# Patient Record
Sex: Male | Born: 1970 | Race: White | Hispanic: No | Marital: Married | Smoking: Never smoker
Health system: Southern US, Community
[De-identification: ages and names within clinical notes are randomized; demographics above are authoritative.]

## PROBLEM LIST (undated history)

## (undated) DIAGNOSIS — F419 Anxiety disorder, unspecified: Secondary | ICD-10-CM

## (undated) DIAGNOSIS — I10 Essential (primary) hypertension: Secondary | ICD-10-CM

## (undated) DIAGNOSIS — K529 Noninfective gastroenteritis and colitis, unspecified: Secondary | ICD-10-CM

## (undated) DIAGNOSIS — G43909 Migraine, unspecified, not intractable, without status migrainosus: Secondary | ICD-10-CM

## (undated) HISTORY — PX: FOOT SURGERY: SHX648

---

## 2015-11-13 ENCOUNTER — Encounter (HOSPITAL_BASED_OUTPATIENT_CLINIC_OR_DEPARTMENT_OTHER): Payer: Self-pay

## 2015-11-13 ENCOUNTER — Emergency Department (HOSPITAL_BASED_OUTPATIENT_CLINIC_OR_DEPARTMENT_OTHER)
Admission: EM | Admit: 2015-11-13 | Discharge: 2015-11-13 | Disposition: A | Discharge status: Home / Self Care | Payer: BLUE CROSS/BLUE SHIELD | Attending: Emergency Medicine | Admitting: Emergency Medicine

## 2015-11-13 DIAGNOSIS — I10 Essential (primary) hypertension: Secondary | ICD-10-CM | POA: Diagnosis not present

## 2015-11-13 DIAGNOSIS — R1084 Generalized abdominal pain: Secondary | ICD-10-CM | POA: Insufficient documentation

## 2015-11-13 DIAGNOSIS — Z79899 Other long term (current) drug therapy: Secondary | ICD-10-CM | POA: Diagnosis not present

## 2015-11-13 DIAGNOSIS — R1013 Epigastric pain: Secondary | ICD-10-CM | POA: Diagnosis present

## 2015-11-13 HISTORY — DX: Noninfective gastroenteritis and colitis, unspecified: K52.9

## 2015-11-13 HISTORY — DX: Essential (primary) hypertension: I10

## 2015-11-13 HISTORY — DX: Anxiety disorder, unspecified: F41.9

## 2015-11-13 HISTORY — DX: Migraine, unspecified, not intractable, without status migrainosus: G43.909

## 2015-11-13 LAB — URINALYSIS, ROUTINE W REFLEX MICROSCOPIC
Bilirubin Urine: NEGATIVE
Glucose, UA: NEGATIVE mg/dL
HGB URINE DIPSTICK: NEGATIVE
Ketones, ur: NEGATIVE mg/dL
LEUKOCYTES UA: NEGATIVE
NITRITE: NEGATIVE
Protein, ur: NEGATIVE mg/dL
SPECIFIC GRAVITY, URINE: 1.02 (ref 1.005–1.030)
pH: 5.5 (ref 5.0–8.0)

## 2015-11-13 LAB — COMPREHENSIVE METABOLIC PANEL
ALK PHOS: 70 U/L (ref 38–126)
ALT: 31 U/L (ref 17–63)
AST: 23 U/L (ref 15–41)
Albumin: 4 g/dL (ref 3.5–5.0)
Anion gap: 5 (ref 5–15)
BUN: 15 mg/dL (ref 6–20)
CALCIUM: 8.6 mg/dL — AB (ref 8.9–10.3)
CHLORIDE: 108 mmol/L (ref 101–111)
CO2: 24 mmol/L (ref 22–32)
Creatinine, Ser: 0.93 mg/dL (ref 0.61–1.24)
GLUCOSE: 101 mg/dL — AB (ref 65–99)
Potassium: 3.7 mmol/L (ref 3.5–5.1)
Sodium: 137 mmol/L (ref 135–145)
Total Bilirubin: 0.5 mg/dL (ref 0.3–1.2)
Total Protein: 7 g/dL (ref 6.5–8.1)

## 2015-11-13 LAB — CBC WITH DIFFERENTIAL/PLATELET
Basophils Absolute: 0 10*3/uL (ref 0.0–0.1)
Basophils Relative: 0 %
Eosinophils Absolute: 0.1 10*3/uL (ref 0.0–0.7)
Eosinophils Relative: 1 %
HEMATOCRIT: 42.4 % (ref 39.0–52.0)
HEMOGLOBIN: 14.8 g/dL (ref 13.0–17.0)
LYMPHS ABS: 2.4 10*3/uL (ref 0.7–4.0)
LYMPHS PCT: 40 %
MCH: 30.9 pg (ref 26.0–34.0)
MCHC: 34.9 g/dL (ref 30.0–36.0)
MCV: 88.5 fL (ref 78.0–100.0)
Monocytes Absolute: 0.4 10*3/uL (ref 0.1–1.0)
Monocytes Relative: 7 %
NEUTROS ABS: 3.1 10*3/uL (ref 1.7–7.7)
NEUTROS PCT: 52 %
Platelets: 190 10*3/uL (ref 150–400)
RBC: 4.79 MIL/uL (ref 4.22–5.81)
RDW: 13.3 % (ref 11.5–15.5)
WBC: 5.9 10*3/uL (ref 4.0–10.5)

## 2015-11-13 MED ORDER — NAPROXEN 250 MG PO TABS
500.0000 mg | ORAL_TABLET | Freq: Once | ORAL | Status: AC
  Administered 2015-11-13: 500 mg via ORAL
  Filled 2015-11-13: qty 2

## 2015-11-13 NOTE — ED Triage Notes (Signed)
C/o abd pain "for months"-worse x 1 denies n/v/d-NAD-steady gait

## 2015-11-13 NOTE — Discharge Instructions (Signed)
We saw you in the ER for the abdominal pain. All of our results are normal. Kidney function is fine as well. We are not sure what is causing your abdominal pain, and recommend that you see your primary care doctor within 2-3 days for further evaluation. If your symptoms get worse, return to the ER. Take the pain meds and nausea meds as prescribed.

## 2015-11-13 NOTE — ED Provider Notes (Signed)
[facility]-EMERGENCY DEPT Provider Note   CSN: [id number] Arrival date & time: 11/13/15  1455     History   Chief Complaint Chief Complaint  Patient presents with  . Abdominal Pain    HPI Cristian Morales is a 45 y.o. male.  HPI Pt comes in with cc of abd pain. Pt reports that in May he did a mission trip to [place], and then developed infectious diarrhea. Pt took the antibiotics, and some of the symptoms persisted, so he saw GI doctor end of aug. PT had a colonoscopy and he was told he had colitis and started on different meds. This morning when he woke up, he had pain in the epigastrium and in the lower quadrants. Pain has been constant. He had a normal BM and denies nausea, emesis. Pain is 4/10 at this time. No dysuria.   Past Medical History:  Diagnosis Date  . Anxiety   . Colitis   . Hypertension   . Migraine     There are no active problems to display for this patient.   Past Surgical History:  Procedure Laterality Date  . FOOT SURGERY         Home Medications    Prior to Admission medications   Medication Sig Start Date End Date Taking? Authorizing Provider  buPROPion (WELLBUTRIN) 75 MG tablet Take 75 mg by mouth 2 (two) times daily.   Yes Historical Provider, MD  valsartan (DIOVAN) 160 MG tablet Take 160 mg by mouth daily.   Yes Historical Provider, MD  zonisamide (ZONEGRAN) 100 MG capsule Take 100 mg by mouth daily.   Yes Historical Provider, MD    Family History No family history on file.  Social History Social History  Substance Use Topics  . Smoking status: Never Smoker  . Smokeless tobacco: Never Used  . Alcohol use No     Allergies   Imitrex [sumatriptan]   Review of Systems Review of Systems   ROS 10 Systems reviewed and are negative for acute change except as noted in the HPI.     Physical Exam Updated Vital Signs BP 128/83   Pulse 61   Temp 98.7 F (37.1 C) (Oral)   Resp 16   Ht 6\' 2"  (1.88 m)   Wt (!) 320 lb (145.2 kg)   SpO2  99%   BMI 41.09 kg/m   Physical Exam  Constitutional: He is oriented to person, place, and time. He appears well-developed.  HENT:  Head: Normocephalic and atraumatic.  Eyes: Conjunctivae and EOM are normal. Pupils are equal, round, and reactive to light.  Neck: Normal range of motion. Neck supple.  Cardiovascular: Normal rate and regular rhythm.   Pulmonary/Chest: Effort normal and breath sounds normal.  Abdominal: Soft. Bowel sounds are normal. He exhibits no distension. There is tenderness. There is no rebound and no guarding.  Generalized tenderness without any rebound or guarding.  Neurological: He is alert and oriented to person, place, and time.  Skin: Skin is warm.     ED Treatments / Results  Labs (all labs ordered are listed, but only abnormal results are displayed) Labs Reviewed  COMPREHENSIVE METABOLIC PANEL - Abnormal; Notable for the following:       Result Value   Glucose, Bld 101 (*)    Calcium 8.6 (*)    All other components within normal limits  URINALYSIS, ROUTINE W REFLEX MICROSCOPIC (NOT AT [facility])  CBC WITH DIFFERENTIAL/PLATELET    EKG  EKG Interpretation None  Radiology No results found.  Procedures Procedures (including critical care time)  Medications Ordered in ED Medications  naproxen (NAPROSYN) tablet 500 mg (500 mg Oral Given 11/13/15 1716)     Initial Impression / Assessment and Plan / ED Course  I have reviewed the triage vital signs and the nursing notes.  Pertinent labs & imaging results that were available during my care of the patient were reviewed by me and considered in my medical decision making (see chart for details).  Clinical Course    Pt comes in with cc of abd pain. His exam and vital signs are reassuring. Lab results pending. Pt has had recent colitis discovered post colonoscopy. His pain is not significant enough where we suspect perforation due to colonoscopy, and he is post procedure by > 1 week. If labs  neg, we will advise close f/u with pcp.  Final Clinical Impressions(s) / ED Diagnoses   Final diagnoses:  Generalized abdominal pain    New Prescriptions Discharge Medication List as of 11/13/2015  5:51 PM       Derwood KaplanAnkit Mena Lienau, MD 11/19/15 0200

## 2017-06-29 ENCOUNTER — Emergency Department (HOSPITAL_BASED_OUTPATIENT_CLINIC_OR_DEPARTMENT_OTHER)
Admission: EM | Admit: 2017-06-29 | Discharge: 2017-06-29 | Disposition: A | Discharge status: Home / Self Care | Payer: BLUE CROSS/BLUE SHIELD | Attending: Emergency Medicine | Admitting: Emergency Medicine

## 2017-06-29 ENCOUNTER — Encounter (HOSPITAL_BASED_OUTPATIENT_CLINIC_OR_DEPARTMENT_OTHER): Payer: Self-pay

## 2017-06-29 ENCOUNTER — Other Ambulatory Visit: Payer: Self-pay

## 2017-06-29 DIAGNOSIS — S6982XA Other specified injuries of left wrist, hand and finger(s), initial encounter: Secondary | ICD-10-CM | POA: Diagnosis present

## 2017-06-29 DIAGNOSIS — Y929 Unspecified place or not applicable: Secondary | ICD-10-CM | POA: Diagnosis not present

## 2017-06-29 DIAGNOSIS — W260XXA Contact with knife, initial encounter: Secondary | ICD-10-CM | POA: Diagnosis not present

## 2017-06-29 DIAGNOSIS — Z79899 Other long term (current) drug therapy: Secondary | ICD-10-CM | POA: Insufficient documentation

## 2017-06-29 DIAGNOSIS — S61215A Laceration without foreign body of left ring finger without damage to nail, initial encounter: Secondary | ICD-10-CM | POA: Insufficient documentation

## 2017-06-29 DIAGNOSIS — Y999 Unspecified external cause status: Secondary | ICD-10-CM | POA: Diagnosis not present

## 2017-06-29 DIAGNOSIS — Y93G1 Activity, food preparation and clean up: Secondary | ICD-10-CM | POA: Diagnosis not present

## 2017-06-29 DIAGNOSIS — I10 Essential (primary) hypertension: Secondary | ICD-10-CM | POA: Insufficient documentation

## 2017-06-29 NOTE — ED Triage Notes (Signed)
Pt states cut his lt 4th digit on Saturday, bandage applied, bleeding controlled at this time

## 2017-06-29 NOTE — ED Notes (Signed)
ED Provider at bedside. 

## 2017-06-29 NOTE — Discharge Instructions (Signed)
It was my pleasure taking care of you today!   Continue to keep wound clean and dry.   Keep covered with topical antibiotic ointment when working with your hands (cooking, gardening, manual labor, anything where hands could get dirty).   Follow up with your primary care doctor or return to ER if redness develops around the wound, pus drains from the wound, new or worsening symptoms develop or if you have any additional concerns.

## 2017-06-29 NOTE — ED Provider Notes (Signed)
[facility] EMERGENCY DEPARTMENT Provider Note   CSN: [id number] Arrival date & time: 06/29/17  0906     History   Chief Complaint Chief Complaint  Patient presents with  . Laceration    HPI Cristian Morales is a 47 y.o. male.  The history is provided by the patient and medical records. No language interpreter was used.  Laceration     Cristian Morales is a 47 y.o. male  with a PMH of HTN, migraines, anxiety who presents to the Emergency Department complaining of laceration to tip of left 4th digit which occurred two nights ago while cooking dinner. Cut the finger tip on a knife. Last tetanus shot was 2 years ago. He has been soaking it in water/peroxide. He was concerned because the area still seems to be oozing blood intermittently this morning. Denies any pus drainage or redness around the wound. Bleeding currently controlled. No hx of DM.   Past Medical History:  Diagnosis Date  . Anxiety   . Colitis   . Hypertension   . Migraine     There are no active problems to display for this patient.   Past Surgical History:  Procedure Laterality Date  . FOOT SURGERY          Home Medications    Prior to Admission medications   Medication Sig Start Date End Date Taking? Authorizing Provider  buPROPion (WELLBUTRIN) 75 MG tablet Take 75 mg by mouth 2 (two) times daily.    [provider]  valsartan (DIOVAN) 160 MG tablet Take 160 mg by mouth daily.    [provider]  zonisamide (ZONEGRAN) 100 MG capsule Take 100 mg by mouth daily.    [provider]    Family History No family history on file.  Social History Social History   Tobacco Use  . Smoking status: Never Smoker  . Smokeless tobacco: Never Used  Substance Use Topics  . Alcohol use: No  . Drug use: No     Allergies   Imitrex [sumatriptan]   Review of Systems Review of Systems  Constitutional: Negative for fever.  Skin: Positive for wound. Negative for color  change.  Neurological: Negative for weakness and numbness.     Physical Exam Updated Vital Signs BP 116/88 (BP Location: Right Arm)   Pulse 99   Temp 98.2 F (36.8 C) (Oral)   Resp 16   Ht 6\' 2"  (1.88 m)   Wt (!) 142.9 kg (315 lb)   SpO2 100%   BMI 40.44 kg/m   Physical Exam  Constitutional: He appears well-developed and well-nourished. No distress.  HENT:  Head: Normocephalic and atraumatic.  Neck: Neck supple.  Cardiovascular: Normal rate, regular rhythm and normal heart sounds.  No murmur heard. Pulmonary/Chest: Effort normal and breath sounds normal. No respiratory distress. He has no wheezes. He has no rales.  Musculoskeletal:  Full ROM of the left hand. 2+ radial pulse. Sensation intact. Good cap refill.  Neurological: He is alert.  Skin: Skin is warm and dry.  Left 4th fingertip is 'C' shaped 1.5 cm laceration. Laceration borders scabbed over. No active bleeding. No warmth. No surrounding erythema or drainage.  Nursing note and vitals reviewed.    ED Treatments / Results  Labs (all labs ordered are listed, but only abnormal results are displayed) Labs Reviewed - No data to display  EKG None  Radiology No results found.  Procedures Procedures (including critical care time)  Medications Ordered in ED Medications - No data  to display   Initial Impression / Assessment and Plan / ED Course  I have reviewed the triage vital signs and the nursing notes.  Pertinent labs & imaging results that were available during my care of the patient were reviewed by me and considered in my medical decision making (see chart for details).    Cristian Morales is a 47 y.o. male who presents to ED for laceration which occurred > 36 hours ago to the left 4th finger tip. Bleeding is controlled. Tetanus is up to date. Wound is started to scab over. Risk of suturing at this delayed presentation with infection risk greater than benefit he would receive with wound healing. No signs of  infection on exam. Wound care instructions discussed. Return precautions discussed. All questions answered.   Final Clinical Impressions(s) / ED Diagnoses   Final diagnoses:  Laceration of left ring finger without foreign body without damage to nail, initial encounter    ED Discharge Orders    None       Malene Blaydes, Chase Picket, PA-C 06/29/17 1029    Loren Racer, MD 06/29/17 [phone]

## 2021-07-23 ENCOUNTER — Emergency Department (HOSPITAL_BASED_OUTPATIENT_CLINIC_OR_DEPARTMENT_OTHER): Payer: BC Managed Care – PPO

## 2021-07-23 ENCOUNTER — Encounter (HOSPITAL_BASED_OUTPATIENT_CLINIC_OR_DEPARTMENT_OTHER): Payer: Self-pay

## 2021-07-23 ENCOUNTER — Emergency Department (HOSPITAL_BASED_OUTPATIENT_CLINIC_OR_DEPARTMENT_OTHER)
Admission: EM | Admit: 2021-07-23 | Discharge: 2021-07-23 | Disposition: A | Discharge status: Home / Self Care | Payer: BC Managed Care – PPO | Attending: Emergency Medicine | Admitting: Emergency Medicine

## 2021-07-23 ENCOUNTER — Other Ambulatory Visit: Payer: Self-pay

## 2021-07-23 DIAGNOSIS — R079 Chest pain, unspecified: Secondary | ICD-10-CM | POA: Insufficient documentation

## 2021-07-23 DIAGNOSIS — M25512 Pain in left shoulder: Secondary | ICD-10-CM | POA: Diagnosis not present

## 2021-07-23 DIAGNOSIS — I10 Essential (primary) hypertension: Secondary | ICD-10-CM | POA: Diagnosis not present

## 2021-07-23 DIAGNOSIS — Z79899 Other long term (current) drug therapy: Secondary | ICD-10-CM | POA: Diagnosis not present

## 2021-07-23 LAB — TROPONIN I (HIGH SENSITIVITY)
Troponin I (High Sensitivity): 3 ng/L (ref <18)
Troponin I (High Sensitivity): 3 ng/L (ref <18)

## 2021-07-23 LAB — CBC WITH DIFFERENTIAL/PLATELET
Abs Immature Granulocytes: 0.02 10*3/uL (ref 0.00–0.07)
Basophils Absolute: 0 10*3/uL (ref 0.0–0.1)
Basophils Relative: 1 %
Eosinophils Absolute: 0.2 10*3/uL (ref 0.0–0.5)
Eosinophils Relative: 2 %
HCT: 41.8 % (ref 39.0–52.0)
Hemoglobin: 14.4 g/dL (ref 13.0–17.0)
Immature Granulocytes: 0 %
Lymphocytes Relative: 37 %
Lymphs Abs: 2.4 10*3/uL (ref 0.7–4.0)
MCH: 30.8 pg (ref 26.0–34.0)
MCHC: 34.4 g/dL (ref 30.0–36.0)
MCV: 89.3 fL (ref 80.0–100.0)
Monocytes Absolute: 0.5 10*3/uL (ref 0.1–1.0)
Monocytes Relative: 7 %
Neutro Abs: 3.4 10*3/uL (ref 1.7–7.7)
Neutrophils Relative %: 53 %
Platelets: 232 10*3/uL (ref 150–400)
RBC: 4.68 MIL/uL (ref 4.22–5.81)
RDW: 13.2 % (ref 11.5–15.5)
WBC: 6.4 10*3/uL (ref 4.0–10.5)
nRBC: 0 % (ref 0.0–0.2)

## 2021-07-23 LAB — BASIC METABOLIC PANEL
Anion gap: 6 (ref 5–15)
BUN: 19 mg/dL (ref 6–20)
CO2: 23 mmol/L (ref 22–32)
Calcium: 8.8 mg/dL — ABNORMAL LOW (ref 8.9–10.3)
Chloride: 109 mmol/L (ref 98–111)
Creatinine, Ser: 0.95 mg/dL (ref 0.61–1.24)
GFR, Estimated: >60 mL/min (ref >60)
Glucose, Bld: 121 mg/dL — ABNORMAL HIGH (ref 70–99)
Potassium: 4.2 mmol/L (ref 3.5–5.1)
Sodium: 138 mmol/L (ref 135–145)

## 2021-07-23 MED ORDER — ACETAMINOPHEN 325 MG PO TABS
650.0000 mg | ORAL_TABLET | Freq: Once | ORAL | Status: AC
  Administered 2021-07-23: 650 mg via ORAL
  Filled 2021-07-23: qty 2

## 2021-07-23 NOTE — Discharge Instructions (Signed)
Overall suspect that this is a musculoskeletal process.  Cardiac work-up is normal.  Recommend close follow-up with her primary care doctor for reevaluation.  Recommend Tylenol, ibuprofen, rest.   ?

## 2021-07-23 NOTE — ED Triage Notes (Addendum)
Pt reports noticed pain in left hand that has moved up into chest approx an hour ago.Pain is worse in left and and arm ?

## 2021-07-23 NOTE — ED Notes (Signed)
EDP at bedside  

## 2021-07-23 NOTE — ED Notes (Signed)
Dc instructions reviewed with pt no questions or concerns at this time. Decline wheelchair at discharge and ambulated without difficulty.  ?

## 2021-07-23 NOTE — ED Provider Notes (Signed)
?[facility] EMERGENCY DEPARTMENT ?Provider Note ? ? ?CSN: [id number] ?Arrival date & time: 07/23/21  1144 ? ?  ? ?History ? ?Chief Complaint  ?Patient presents with  ? Chest Pain  ? ? ?Cristian Morales is a 51 y.o. male. ? 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  Started to develop some chest pain, shoulder pain.  No cough or shortness of breath.  Has not had fevers or chills.  Has not had chest pain or similar discomfort in the past.  History of high blood pressure.  No smoking history.  No recent surgery or travel.  Pain may be worse with movement and palpation. ? ?The history is provided by the patient.  ?Chest Pain ?Pain location:  L chest ?Pain quality: aching   ?Pain radiates to:  L arm ?Pain severity:  Mild ?Onset quality:  Gradual ?Duration:  2 hours ?Timing:  Constant ?Progression:  Improving ?Chronicity:  New ?Context: at rest   ?Relieved by:  Nothing ?Worsened by:  Nothing ? ?  ? ?Home Medications ?Prior to Admission medications   ?Medication Sig Start Date End Date Taking? Authorizing Provider  ?buPROPion (WELLBUTRIN) 75 MG tablet Take 75 mg by mouth 2 (two) times daily.    [provider]  ?valsartan (DIOVAN) 160 MG tablet Take 160 mg by mouth daily.    [provider]  ?zonisamide (ZONEGRAN) 100 MG capsule Take 100 mg by mouth daily.    [provider]  ?   ? ?Allergies    ?Imitrex [sumatriptan]   ? ?Review of Systems   ?Review of Systems  ?Cardiovascular:  Positive for chest pain.  ? ?Physical Exam ?Updated Vital Signs ?BP 122/67   Pulse (!) 58   Temp 98.2 ?F (36.8 ?C)   Resp 19   Ht 6\' 2"  (1.88 m)   Wt (!) 167.8 kg   SpO2 97%   BMI 47.51 kg/m?  ?Physical Exam ?Vitals and nursing note reviewed.  ?Constitutional:   ?   General: He is not in acute distress. ?   Appearance: He is well-developed. He is not ill-appearing.  ?HENT:  ?   Head: Normocephalic and atraumatic.  ?Eyes:  ?   Extraocular Movements: Extraocular movements  intact.  ?   Conjunctiva/sclera: Conjunctivae normal.  ?   Pupils: Pupils are equal, round, and reactive to light.  ?Cardiovascular:  ?   Rate and Rhythm: Normal rate and regular rhythm.  ?   Pulses:     ?     Radial pulses are 2+ on the right side and 2+ on the left side.  ?   Heart sounds: Normal heart sounds. No murmur heard. ?Pulmonary:  ?   Effort: Pulmonary effort is normal. No respiratory distress.  ?   Breath sounds: Normal breath sounds.  ?Chest:  ?   Chest wall: Tenderness present.  ?Abdominal:  ?   Palpations: Abdomen is soft.  ?   Tenderness: There is no abdominal tenderness.  ?Musculoskeletal:     ?   General: No swelling. Normal range of motion.  ?   Cervical back: Normal range of motion and neck supple.  ?   Right lower leg: No edema.  ?   Left lower leg: No edema.  ?Skin: ?   General: Skin is warm and dry.  ?   Capillary Refill: Capillary refill takes less than 2 seconds.  ?Neurological:  ?   Mental Status: He is alert.  ?Psychiatric:     ?  Mood and Affect: Mood normal.  ? ? ?ED Results / Procedures / Treatments   ?Labs ?(all labs ordered are listed, but only abnormal results are displayed) ?Labs Reviewed  ?BASIC METABOLIC PANEL - Abnormal; Notable for the following components:  ?    Result Value  ? Glucose, Bld 121 (*)   ? Calcium 8.8 (*)   ? All other components within normal limits  ?CBC WITH DIFFERENTIAL/PLATELET  ?TROPONIN I (HIGH SENSITIVITY)  ?TROPONIN I (HIGH SENSITIVITY)  ? ? ?EKG ?EKG Interpretation ? ?Date/Time:  Tuesday Jul 23 2021 11:52:54 EDT ?Ventricular Rate:  90 ?PR Interval:  189 ?QRS Duration: 101 ?QT Interval:  347 ?QTC Calculation: 425 ?R Axis:   -1 ?Text Interpretation: Sinus rhythm Confirmed by Virgina Norfolk ([id number]) on 07/23/2021 11:53:53 AM ? ?Radiology ?DG Chest Portable 1 View ? ?Result Date: 07/23/2021 ?CLINICAL DATA:  Chest pain EXAM: PORTABLE CHEST 1 VIEW COMPARISON:  None Available. FINDINGS: Heart size upper normal. Vascularity normal. Lungs clear without infiltrate or  effusion. IMPRESSION: No active disease. Electronically Signed   By: Marlan Palau M.D.   On: 07/23/2021 12:30   ? ?Procedures ?Procedures  ? ? ?Medications Ordered in ED ?Medications  ?acetaminophen (TYLENOL) tablet 650 mg (650 mg Oral Given 07/23/21 1221)  ? ? ?ED Course/ Medical Decision Making/ A&P ?  ?                        ?Medical Decision Making ?Amount and/or Complexity of Data Reviewed ?Labs: ordered. ?Radiology: ordered. ? ?Risk ?OTC drugs. ? ? ?Cristian Morales is here with chest pain.  Normal vitals.  No fever.  Heart score is 2.  Wells criteria 0 and doubt PE.  States that after moving an object at work he started to have some chest discomfort, shoulder pain.  Pain is getting better.  Has not had exertional chest pain or chest pain shortness of breath here recently.  Has a history of high blood pressure.  May be some reproducible pain on exam.  Clear breath sounds.  No signs of volume overload.  Well-appearing.  EKG per my review and interpretation shows sinus rhythm.  No ischemic changes.  Differential is ACS versus MSK pain versus pneumonia or pneumothorax.  No concern for PE given no risk factors.  Does not seem like heart failure or infectious process.  Does not have any abdominal pain and have a low concern for pancreatitis or cholecystitis.  Will give Tylenol, check a CBC, BMP, troponin, chest x-ray. ? ?Per my review and interpretation of labs there is no significant anemia, electrolyte abnormality, kidney injury, leukocytosis.  Troponin negative x2.  Chest x-ray with no evidence of pneumonia or pneumothorax.  Overall suspect that this is a musculoskeletal process.  Recommend Tylenol and ibuprofen and close follow-up with primary care doctor.  Discharged in good condition. ? ?This chart was dictated using voice recognition software.  Despite best efforts to proofread,  errors can occur which can change the documentation meaning.  ? ? ? ? ? ? ? ?Final Clinical Impression(s) / ED Diagnoses ?Final  diagnoses:  ?Nonspecific chest pain  ? ? ?Rx / DC Orders ?ED Discharge Orders   ? ? None  ? ?  ? ? ?  ?Virgina Norfolk, DO ?07/23/21 1500 ? ?

## 2022-10-23 IMAGING — DX DG CHEST 1V PORT
2 series · 2 of 2 positions shown · non-contrast
Comparison: None Available.

CLINICAL DATA: Chest pain

EXAM:
PORTABLE CHEST 1 VIEW

[chest ap (1 of 2)]
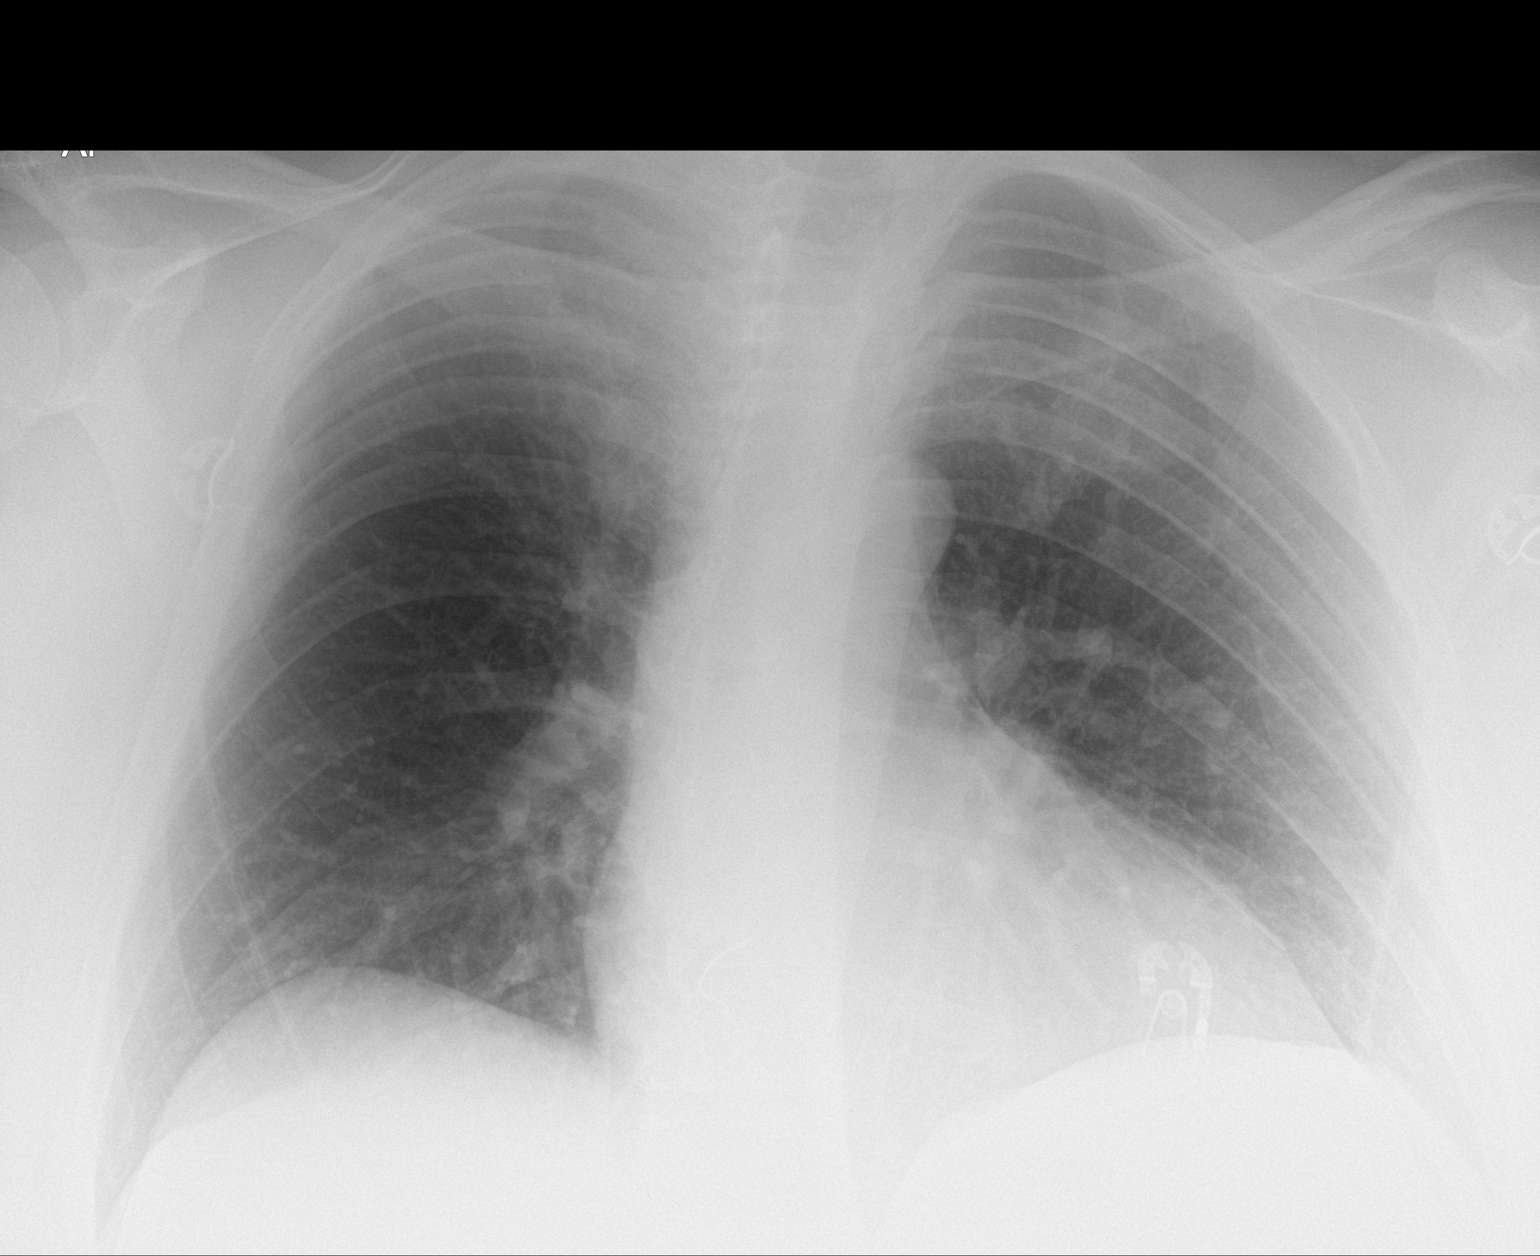

[chest ap (2 of 2)]
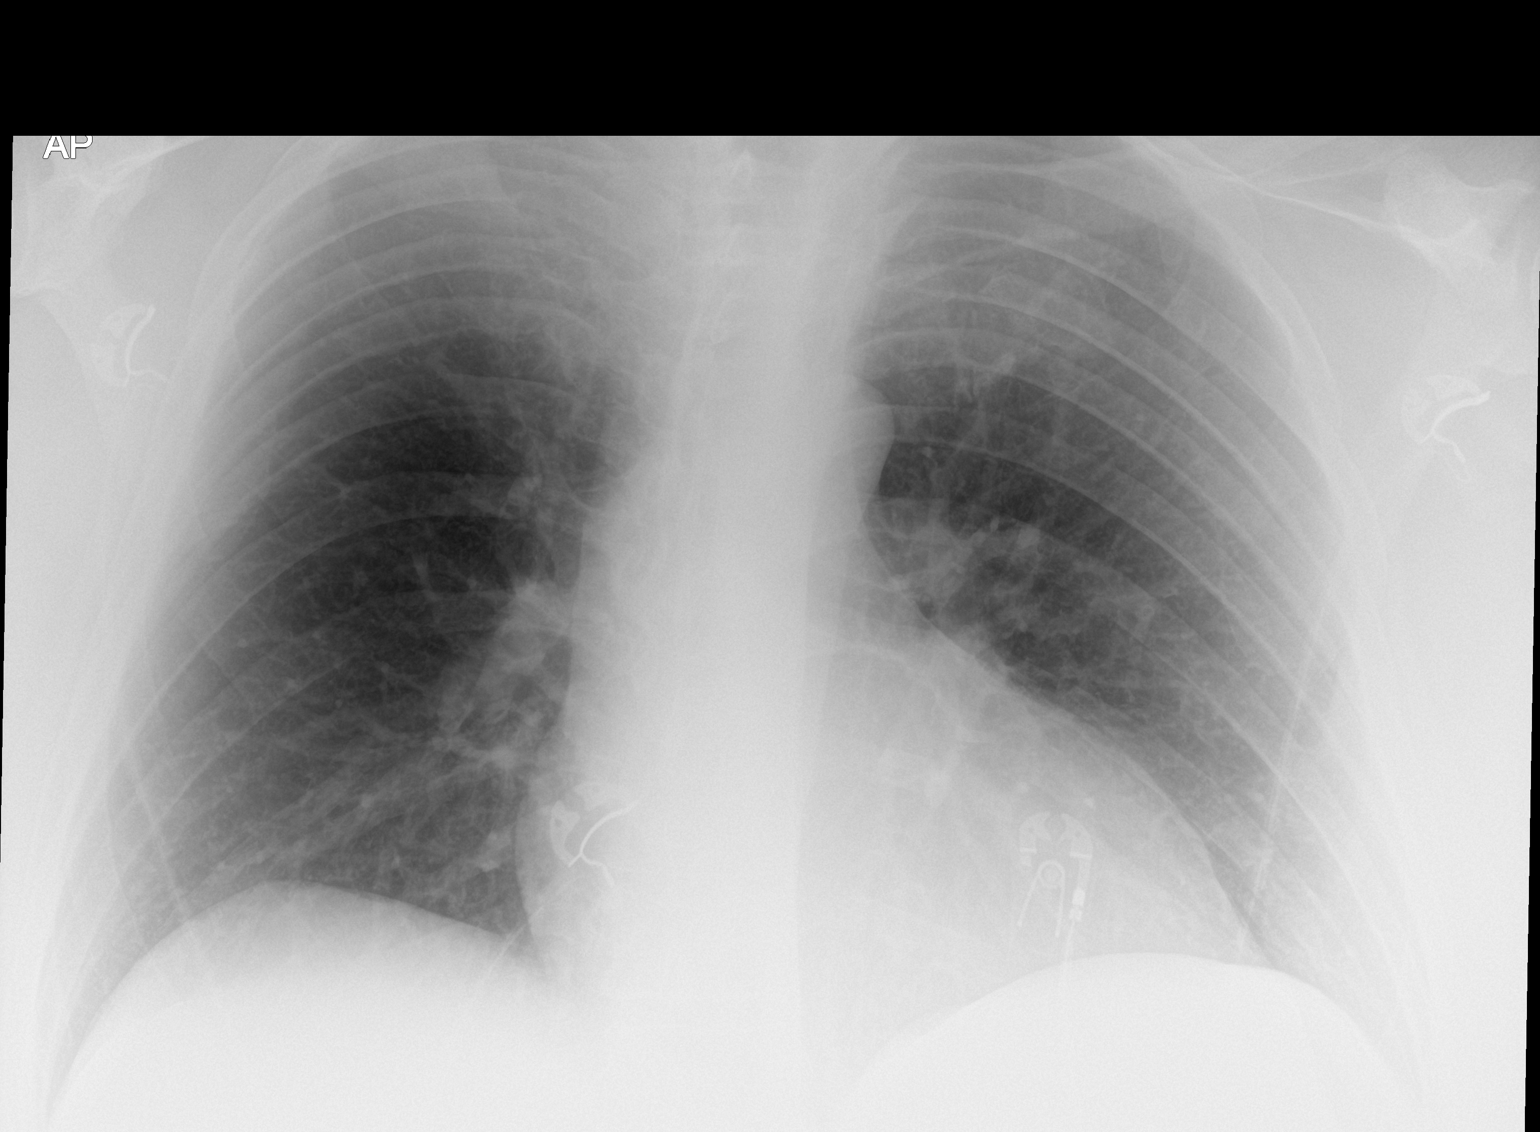

[2 of 2 positions shown; findings below may reference images not displayed]

FINDINGS: Heart size upper normal. Vascularity normal. Lungs clear without
infiltrate or effusion.
IMPRESSION: No active disease.
